# Patient Record
Sex: Male | Born: 1970 | Race: White | Hispanic: No | Marital: Single | Smoking: Current every day smoker
Health system: Southern US, Community
[De-identification: ages and names within clinical notes are randomized; demographics above are authoritative.]

## PROBLEM LIST (undated history)

## (undated) DIAGNOSIS — B1920 Unspecified viral hepatitis C without hepatic coma: Secondary | ICD-10-CM

## (undated) DIAGNOSIS — I10 Essential (primary) hypertension: Secondary | ICD-10-CM

## (undated) DIAGNOSIS — E119 Type 2 diabetes mellitus without complications: Secondary | ICD-10-CM

## (undated) HISTORY — PX: LIVER TRANSPLANT: SHX410

---

## 2018-10-24 ENCOUNTER — Ambulatory Visit
Admission: EM | Admit: 2018-10-24 | Discharge: 2018-10-24 | Disposition: A | Discharge status: Home / Self Care | Payer: 59 | Attending: Family Medicine | Admitting: Family Medicine

## 2018-10-24 ENCOUNTER — Encounter: Payer: Self-pay | Admitting: Gynecology

## 2018-10-24 ENCOUNTER — Ambulatory Visit (INDEPENDENT_AMBULATORY_CARE_PROVIDER_SITE_OTHER): Payer: 59

## 2018-10-24 DIAGNOSIS — S0101XA Laceration without foreign body of scalp, initial encounter: Secondary | ICD-10-CM

## 2018-10-24 DIAGNOSIS — W108XXA Fall (on) (from) other stairs and steps, initial encounter: Secondary | ICD-10-CM

## 2018-10-24 DIAGNOSIS — W1809XA Striking against other object with subsequent fall, initial encounter: Secondary | ICD-10-CM | POA: Diagnosis not present

## 2018-10-24 HISTORY — DX: Essential (primary) hypertension: I10

## 2018-10-24 HISTORY — DX: Unspecified viral hepatitis C without hepatic coma: B19.20

## 2018-10-24 HISTORY — DX: Type 2 diabetes mellitus without complications: E11.9

## 2018-10-24 MED ORDER — MUPIROCIN 2 % EX OINT: TOPICAL_OINTMENT | CUTANEOUS | 0 refills | Status: AC

## 2018-10-24 MED ORDER — LIDOCAINE-EPINEPHRINE-TETRACAINE (LET) SOLUTION
3.0000 mL | Freq: Once | NASAL | Status: AC
  Administered 2018-10-24: 3 mL via TOPICAL

## 2018-10-24 MED ORDER — CEPHALEXIN 500 MG PO CAPS: 500.0000 mg | ORAL_CAPSULE | Freq: Four times a day (QID) | ORAL | 0 refills | Status: AC

## 2018-10-24 NOTE — ED Provider Notes (Signed)
[facility] ____________________________________________  Time seen: Approximately 10:02 AM  I have reviewed the triage vital signs and the nursing notes.   HISTORY  Chief Complaint Head Injury   HPI Chad Obrien is a 47 y.o. male presenting with family bedside for evaluation of head laceration that occurred at approximately 730 this morning.  Patient states that he had gotten a new pair of jeans for Christmas that were too long.  States he was going down the stairs inside this morning, and due to the jeans being too long caused him to slip and trip.  States that he fell down approximately 8 steps, and at the bottom he fell hitting the left side of his head on the bottom of the wall.  Denies loss of consciousness.  States he has a mild headache at this time, particularly around the laceration site.  No alleviating measures attempted prior to arrival.  States pain currently mild to moderate.  Denies aggravating factors.  States that he fell only because of pain slipping.  Denies any accompanying dizziness, vision changes, vision loss, facial pain, neck or back pain, extremity injury, nausea, vomiting, syncope or near syncope.  Denies chest pain or shortness of breath.  Reports tetanus immunization is up to date in the last 10 years.  Reports otherwise doing well.  PCP: in [place].     Past Medical History:  Diagnosis Date  . Diabetes mellitus without complication (HCC)   . 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 Hypertension     There are no active problems to display for this patient.   Past Surgical History:  Procedure Laterality Date  . LIVER TRANSPLANT     hep c from donor liver     No current facility-administered medications for this encounter.   Current Outpatient Medications:  .  aspirin EC 81 MG tablet, Take 81 mg by mouth daily., Disp: , Rfl:  .  Emollient (VELVACHOL) cream, Apply topically as needed., Disp: , Rfl:  .  metformin  (FORTAMET) 500 MG (OSM) 24 hr tablet, Take 500 mg by mouth daily with breakfast., Disp: , Rfl:  .  metoprolol succinate (TOPROL-XL) 50 MG 24 hr tablet, Take 50 mg by mouth daily. Take with or immediately following a meal., Disp: , Rfl:  .  tacrolimus (PROGRAF) 1 MG capsule, Take 1 mg by mouth 2 (two) times daily., Disp: , Rfl:  .  Tenofovir Alafenamide Fumarate (VEMLIDY PO), Take by mouth., Disp: , Rfl:  .  cephALEXin (KEFLEX) 500 MG capsule, Take 1 capsule (500 mg total) by mouth 4 (four) times daily for 5 days., Disp: 20 capsule, Rfl: 0 .  mupirocin ointment (BACTROBAN) 2 %, Apply two times a day for 7-10 days., Disp: 22 g, Rfl: 0  Allergies Patient has no known allergies.  History reviewed. No pertinent family history.  Social History Social History   Tobacco Use  . Smoking status: Current Every Day Smoker    Packs/day: 0.50  . Smokeless tobacco: Never Used  Substance Use Topics  . Alcohol use: Never    Frequency: Never  . Drug use: Never    Review of Systems Constitutional: No fever Eyes: No visual changes. ENT: No sore throat. Cardiovascular: Denies chest pain. Respiratory: Denies shortness of breath. Gastrointestinal: No abdominal pain.  No nausea, no vomiting.   Musculoskeletal: Negative for back pain. Skin: Negative for rash. Neurological: Negative for focal weakness or numbness.  Positive headache.   ____________________________________________   PHYSICAL  EXAM:  VITAL SIGNS: ED Triage Vitals [10/24/18 0851]  Enc Vitals Group     BP (!) 131/95     Pulse Rate 65     Resp 18     Temp 97.6 F (36.4 C)     Temp Source Oral     SpO2 98 %     Weight      Height      Head Circumference      Peak Flow      Pain Score      Pain Loc      Pain Edu?      Excl. in GC?     Constitutional: Alert and oriented. Well appearing and in no acute distress. Eyes: Conjunctivae are normal. PERRL. EOMI. no pain with EOMs. ENT      Head: Normocephalic.  See skin below.       Nose: No congestion      Mouth/Throat: Mucous membranes are moist. Neck: No stridor. Supple without meningismus.  Cardiovascular: Normal rate, regular rhythm. Grossly normal heart sounds.  Good peripheral circulation. Respiratory: Normal respiratory effort without tachypnea nor retractions. Breath sounds are clear and equal bilaterally. No wheezes, rales, rhonchi. Musculoskeletal:   No midline cervical, thoracic or lumbar tenderness to palpation.  Bilateral hand grip strong and equal.  Changes positions quickly. Neurologic:  Normal speech and language. No gross focal neurologic deficits are appreciated. Speech is normal. No gait instability.  Negative pronator drift.  Negative Romberg. Skin:  Skin is warm, dry.  Except: Left posterior left parietal head laceration, 6 cm, linear, mild active bleeding, mild to moderate immediate surrounding tenderness, minimal edema, no other tenderness noted, skin otherwise intact. Psychiatric: Mood and affect are normal. Speech and behavior are normal. Patient exhibits appropriate insight and judgment   ___________________________________________   LABS (all labs ordered are listed, but only abnormal results are displayed)  Labs Reviewed - No data to display  RADIOLOGY  Ct Head Wo Contrast  Result Date: 10/24/2018 CLINICAL DATA:  Patient status post fall down multiple stairs. EXAM: CT HEAD WITHOUT CONTRAST TECHNIQUE: Contiguous axial images were obtained from the base of the skull through the vertex without intravenous contrast. COMPARISON:  None. FINDINGS: Brain: Ventricles and sulci are appropriate for patient's age. No evidence for acute cortically based infarct, intracranial hemorrhage, mass lesion or mass-effect. Vascular: Unremarkable Skull: Intact. Sinuses/Orbits: Paranasal sinuses are well aerated. Mastoid air cells are unremarkable. Orbits are unremarkable. Other: Soft tissue swelling overlying the posterior calvarium (image 20; series 5).  IMPRESSION: No acute intracranial process. Electronically Signed   By: Annia Beltrew  Davis M.D.   On: 10/24/2018 11:05   ____________________________________________   PROCEDURES Procedures   Procedure(s) performed:  Procedure explained and verbal consent obtained. Consent: Verbal consent obtained. Written consent not obtained. Risks and benefits: risks, benefits and alternatives were discussed Patient identity confirmed: verbally with patient and hospital-assigned identification number  Consent given by: patient   Laceration Repair Location: Left posterior scalp Length: 6 cm Foreign bodies: no foreign bodies Tendon involvement: none Nerve involvement: none Preparation: Patient was prepped and draped in the usual sterile fashion. Anesthesia with topical let Me with Betadine Irrigation solution: saline Irrigation method: jet lavage Amount of cleaning: copious Repaired with staples Number of staples: 7 Technique: simple interrupted  Approximation: loose Patient tolerate well. Wound well approximated post repair.  Antibiotic ointment and dressing applied.  Wound care instructions provided.  Observe for any signs of infection or other problems.      INITIAL  IMPRESSION / ASSESSMENT AND PLAN / ED COURSE  Pertinent labs & imaging results that were available during my care of the patient were reviewed by me and considered in my medical decision making (see chart for details).  Well-appearing patient.  No acute distress.  No focal neurological deficits.  CT head negative as above per radiologist.  Wound copiously cleaned, irrigated and repaired as above.  Due to size and patient immunocompetent level, will empirically treat with oral Keflex and topical Bactroban.  Discussed keeping clean, supportive care.  [place] home this weekend to [place].  Counseled staple removal in 10 days, sooner reevaluation for any drainage, pain or worsening concerns.Discussed indication, risks and benefits  of medications with patient.  Discussed follow up with Primary care physician this week. Discussed follow up and return parameters including no resolution or any worsening concerns. Patient verbalized understanding and agreed to plan.   ____________________________________________   FINAL CLINICAL IMPRESSION(S) / ED DIAGNOSES  Final diagnoses:  Laceration of scalp without foreign body, initial encounter     ED Discharge Orders         Ordered    cephALEXin (KEFLEX) 500 MG capsule  4 times daily     10/24/18 1128    mupirocin ointment (BACTROBAN) 2 %     10/24/18 1128           Note: This dictation was prepared with Dragon dictation along with smaller phrase technology. Any transcriptional errors that result from this process are unintentional.         Renford DillsMiller, Cambre Matson, NP 10/24/18 1240

## 2018-10-24 NOTE — Discharge Instructions (Addendum)
Take medication as prescribed. Rest. Drink plenty of fluids. Ice. Monitor. Keep clean.   Staple removal in 10 days.  Follow up with your primary care physician this week as needed. Return to Urgent care for new or worsening concerns.

## 2018-10-24 NOTE — ED Triage Notes (Signed)
Per patient slip of stair and fell. Patient present with laceration to laceration to the back of his ead. Patient deny dizziness / slight headache/ no vision problem

## 2020-09-03 IMAGING — CT CT HEAD W/O CM
1 series · 16 of 30 positions shown, 20 images · non-contrast
Comparison: None.

CLINICAL DATA: Patient status post fall down multiple stairs.

EXAM:
CT HEAD WITHOUT CONTRAST
TECHNIQUE: Contiguous axial images were obtained from the base of the skull
through the vertex without intravenous contrast.

[Series 5: head wo · axial · 0.45mm/px · z∈[-155,-10]mm · 16 of 33 slices shown, 20 images]
[im 2/33  brain]
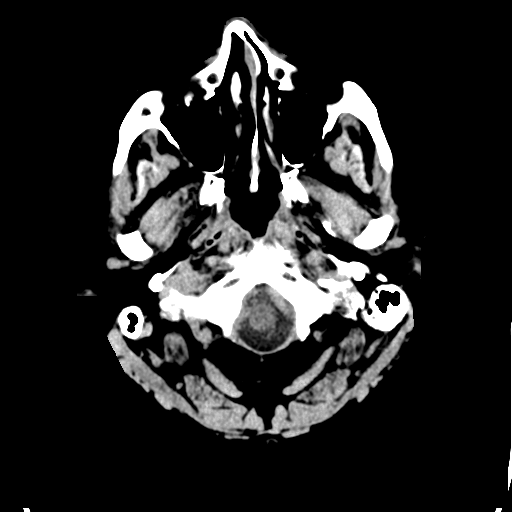
[im 2/33  bone]
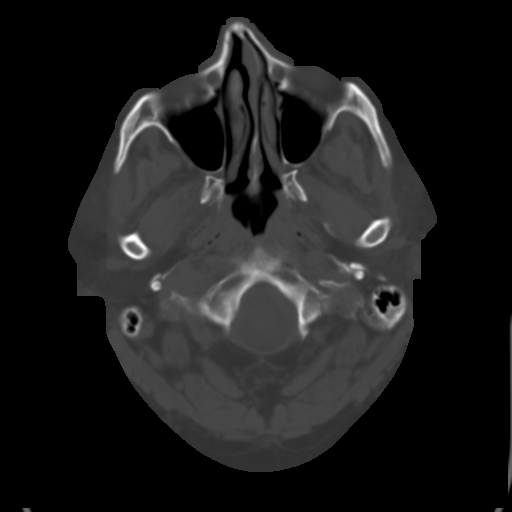
[im 4/33  brain]
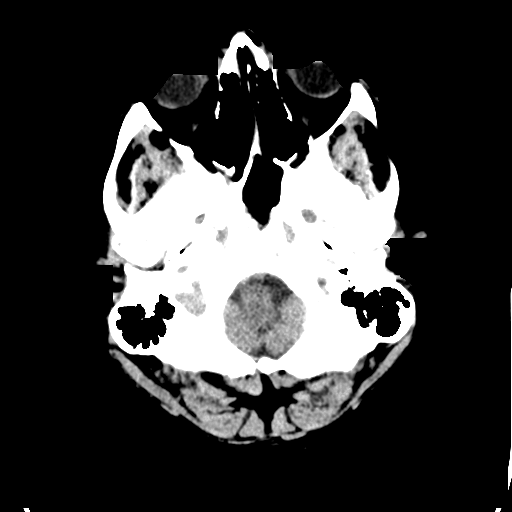
[im 6/33  brain]
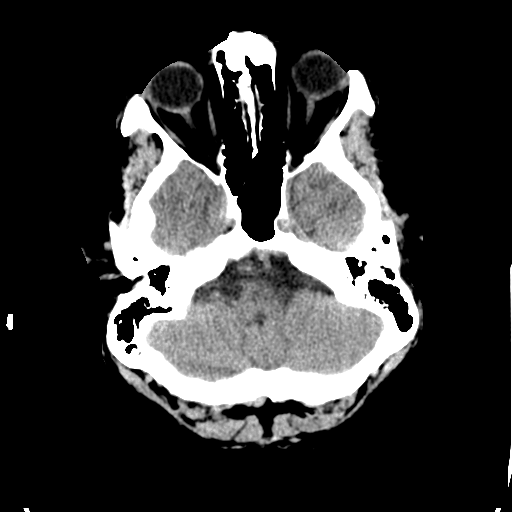
[im 8/33  brain]
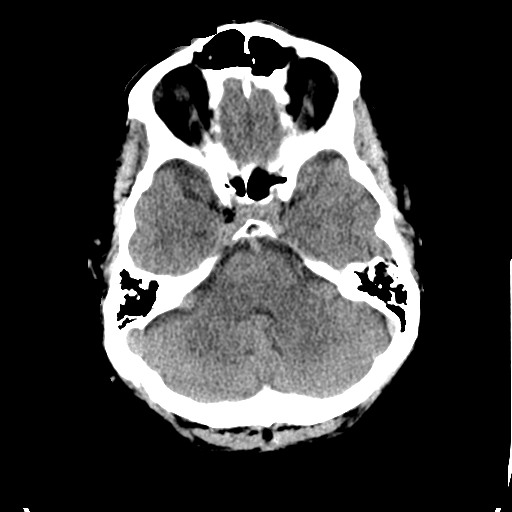
[im 9/33  brain]
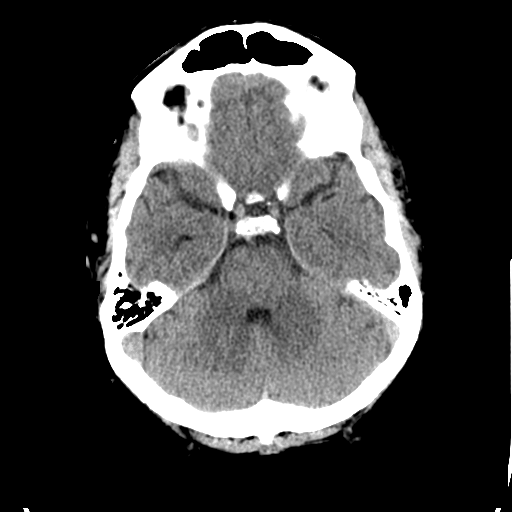
[im 9/33  bone]
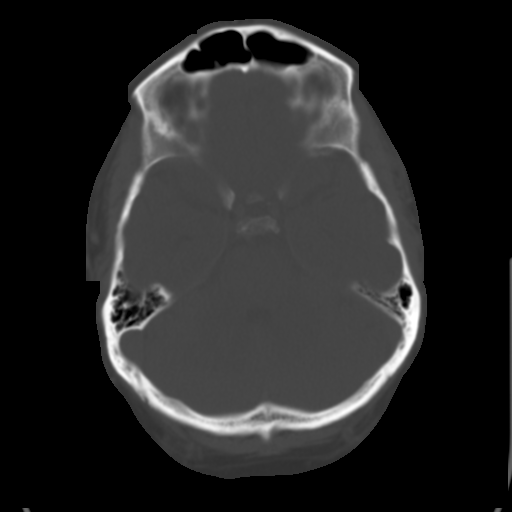
[im 12/33  brain]
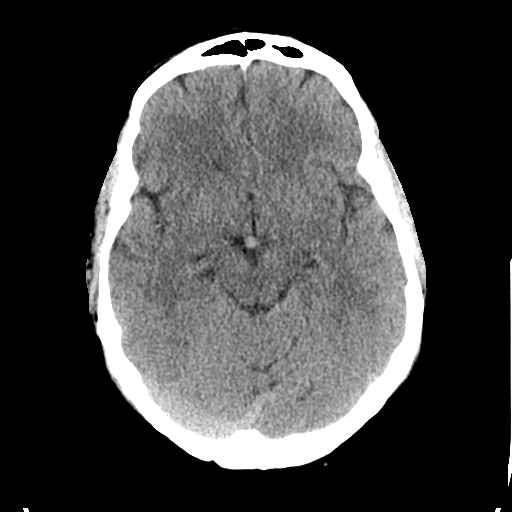
[im 14/33  brain]
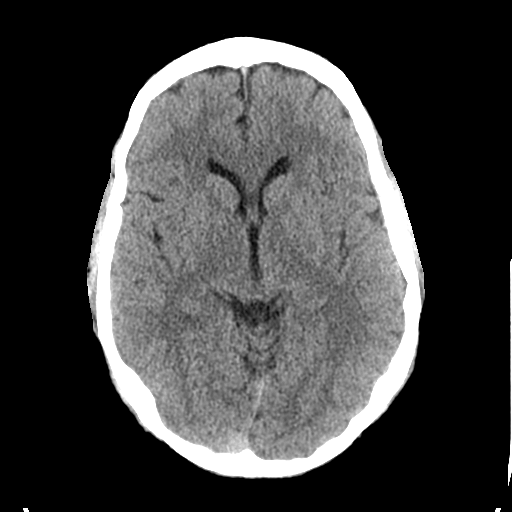
[im 16/33  brain]
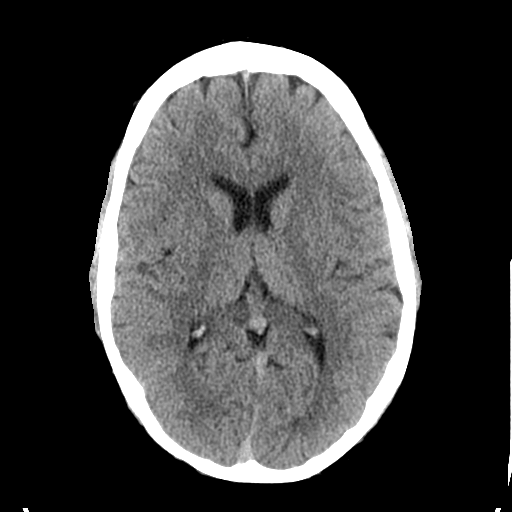
[im 17/33  brain]
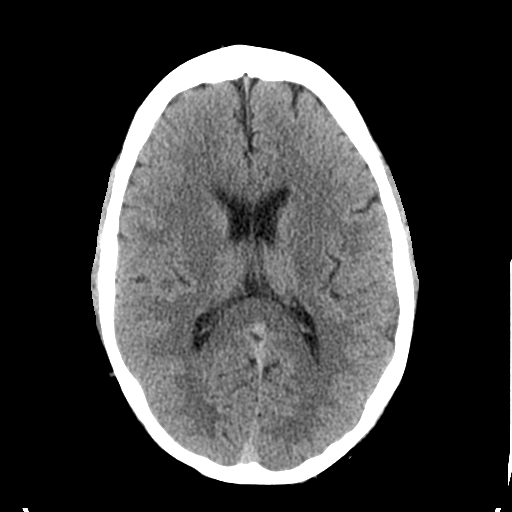
[im 17/33  bone]
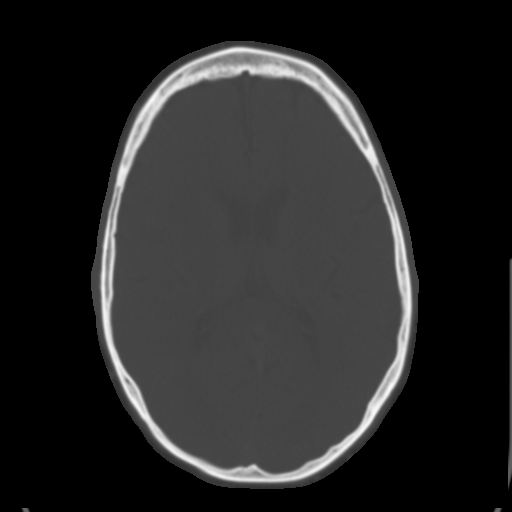
[im 19/33  brain]
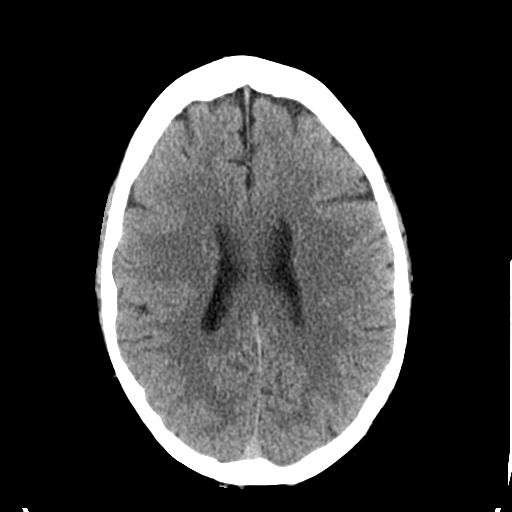
[im 21/33  brain]
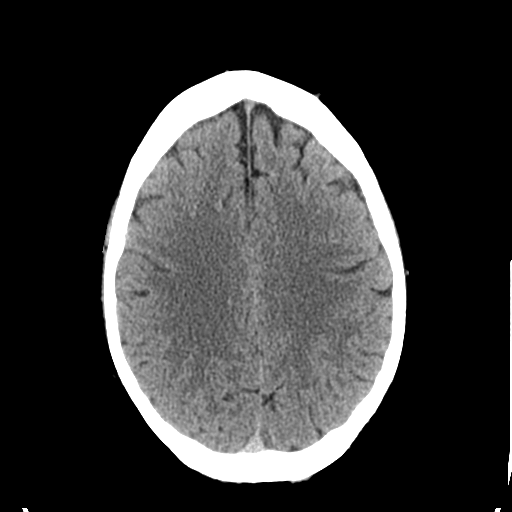
[im 24/33  brain]
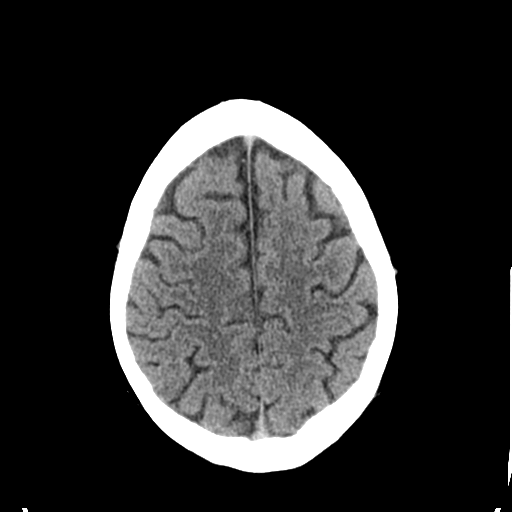
[im 25/33  brain]
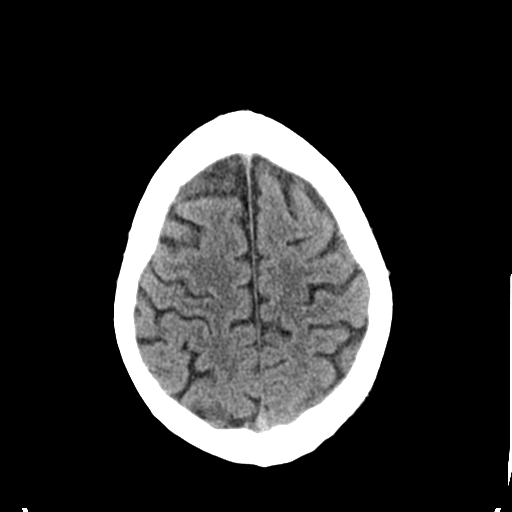
[im 25/33  bone]
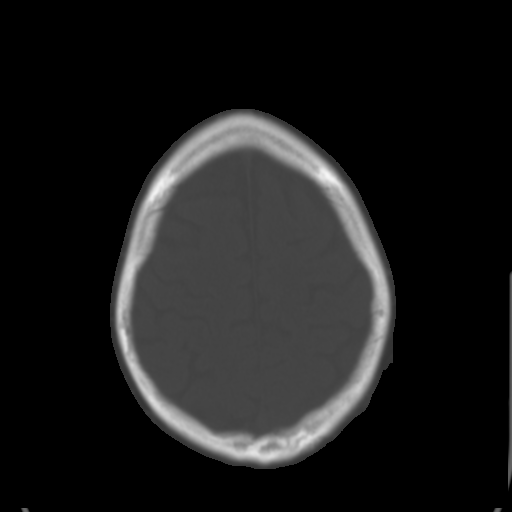
[im 27/33  brain]
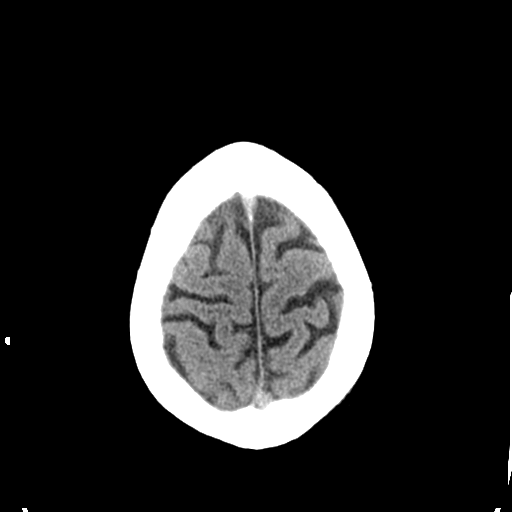
[im 29/33  brain]
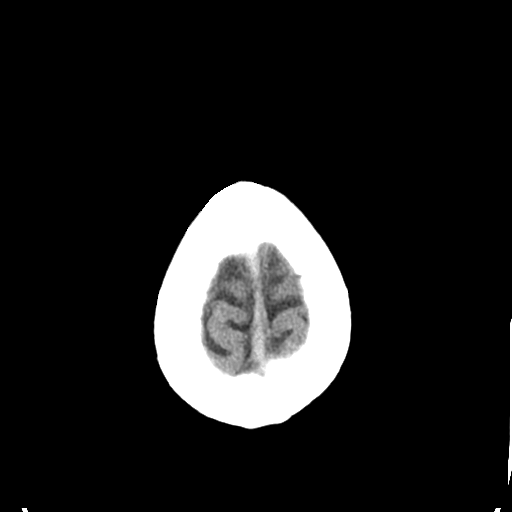
[im 31/33  brain]
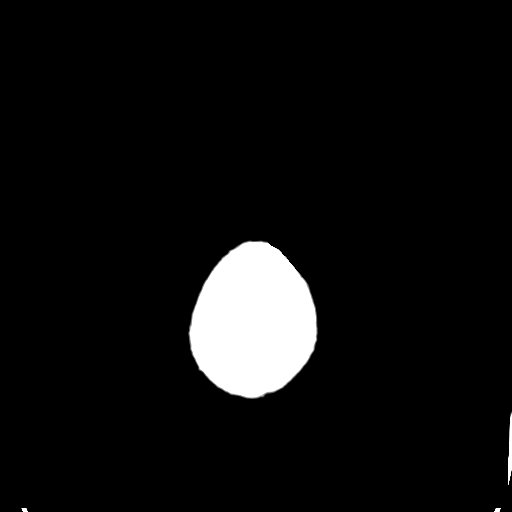

[16 of 30 positions shown; findings below may reference images not displayed]

FINDINGS: Brain: Ventricles and sulci are appropriate for patient's age. No
evidence for acute cortically based infarct, intracranial
hemorrhage, mass lesion or mass-effect.

Vascular: Unremarkable

Skull: Intact.

Sinuses/Orbits: Paranasal sinuses are well aerated. Mastoid air
cells are unremarkable. Orbits are unremarkable.

Other: Soft tissue swelling overlying the posterior calvarium (image
20; series 5).
IMPRESSION: No acute intracranial process.
